# Patient Record
Sex: Female | Born: 2000 | Race: White | Hispanic: No | Marital: Single | Smoking: Never smoker
Health system: Southern US, Community
[De-identification: ages and names within clinical notes are randomized; demographics above are authoritative.]

## PROBLEM LIST (undated history)

## (undated) DIAGNOSIS — E049 Nontoxic goiter, unspecified: Secondary | ICD-10-CM

## (undated) HISTORY — DX: Nontoxic goiter, unspecified: E04.9

---
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
## 2020-01-07 ENCOUNTER — Ambulatory Visit: Payer: Managed Care, Other (non HMO) | Attending: Internal Medicine

## 2020-01-07 DIAGNOSIS — Z23 Encounter for immunization: Secondary | ICD-10-CM

## 2020-01-07 NOTE — Progress Notes (Signed)
   Covid-19 Vaccination Clinic  Name:  Marily Konczal    MRN: [id number] DOB: 2001-04-20  01/07/2020  Ms. Dutan was observed post Covid-19 immunization for 15 minutes without incidence. She was provided with Vaccine Information Sheet and instruction to access the V-Safe system.   Ms. Menard was instructed to call 911 with any severe reactions post vaccine: Marland Kitchen Difficulty breathing  . Swelling of your face and throat  . A fast heartbeat  . A bad rash all over your body  . Dizziness and weakness    Immunizations Administered    Name Date Dose VIS Date Route   Pfizer COVID-19 Vaccine 01/07/2020  2:29 PM 0.3 mL 10/21/2019 Intramuscular   Manufacturer: [employer], [employer]   Lot: [id number]   NDC: [address]-8

## 2020-02-01 ENCOUNTER — Ambulatory Visit: Payer: Managed Care, Other (non HMO) | Attending: Internal Medicine

## 2020-02-01 DIAGNOSIS — Z23 Encounter for immunization: Secondary | ICD-10-CM

## 2020-02-01 NOTE — Progress Notes (Signed)
   Covid-19 Vaccination Clinic  Name:  Melissa Costa    MRN: [id number] DOB: [birth year]  02/01/2020  [name] was observed post Covid-19 immunization for 15 minutes without incident. She was provided with Vaccine Information Sheet and instruction to access the V-Safe system.   [name] was instructed to call 911 with any severe reactions post vaccine: Marland Kitchen Difficulty breathing  . Swelling of face and throat  . A fast heartbeat  . A bad rash all over body  . Dizziness and weakness   Immunizations Administered    Name Date Dose VIS Date Route   Pfizer COVID-19 Vaccine 02/01/2020  1:59 PM 0.3 mL 10/21/2019 Intramuscular   Manufacturer: [employer], [employer]   Lot: [id number]   NDC: [address]-3
